# Patient Record
Sex: Male | Born: 1975 | Race: White | Hispanic: No | Marital: Married | Smoking: Never smoker
Health system: Southern US, Community
[De-identification: ages and names within clinical notes are randomized; demographics above are authoritative.]

## PROBLEM LIST (undated history)

## (undated) DIAGNOSIS — E785 Hyperlipidemia, unspecified: Secondary | ICD-10-CM

## (undated) HISTORY — DX: Hyperlipidemia, unspecified: E78.5

---

## 2000-02-26 ENCOUNTER — Ambulatory Visit (HOSPITAL_BASED_OUTPATIENT_CLINIC_OR_DEPARTMENT_OTHER): Admission: RE | Admit: 2000-02-26 | Discharge: 2000-02-26 | Payer: Self-pay | Admitting: Orthopedic Surgery

## 2000-11-23 HISTORY — PX: SHOULDER SURGERY: SHX246

## 2001-04-23 ENCOUNTER — Emergency Department (HOSPITAL_COMMUNITY): Admission: EM | Admit: 2001-04-23 | Discharge: 2001-04-23 | Payer: Self-pay | Admitting: Emergency Medicine

## 2001-05-08 ENCOUNTER — Emergency Department (HOSPITAL_COMMUNITY): Admission: EM | Admit: 2001-05-08 | Discharge: 2001-05-08 | Payer: Self-pay | Admitting: Emergency Medicine

## 2002-07-13 ENCOUNTER — Emergency Department (HOSPITAL_COMMUNITY): Admission: EM | Admit: 2002-07-13 | Discharge: 2002-07-14 | Payer: Self-pay | Admitting: Emergency Medicine

## 2006-05-19 ENCOUNTER — Emergency Department (HOSPITAL_COMMUNITY): Admission: EM | Admit: 2006-05-19 | Discharge: 2006-05-19 | Payer: Self-pay | Admitting: Emergency Medicine

## 2007-07-21 ENCOUNTER — Ambulatory Visit (HOSPITAL_COMMUNITY): Admission: RE | Admit: 2007-07-21 | Discharge: 2007-07-21 | Payer: Self-pay | Admitting: Internal Medicine

## 2008-10-08 ENCOUNTER — Emergency Department (HOSPITAL_BASED_OUTPATIENT_CLINIC_OR_DEPARTMENT_OTHER): Admission: EM | Admit: 2008-10-08 | Discharge: 2008-10-08 | Payer: Self-pay | Admitting: Emergency Medicine

## 2009-07-24 IMAGING — CR DG FACIAL BONES 1-2V
4 series · 4 of 4 positions shown · non-contrast
Comparison: None.

CLINICAL DATA: 32-year-old male status post blunt trauma to the
mandible.

FACIAL BONES - 1-2 VIEW

[t waters (1 of 2)]
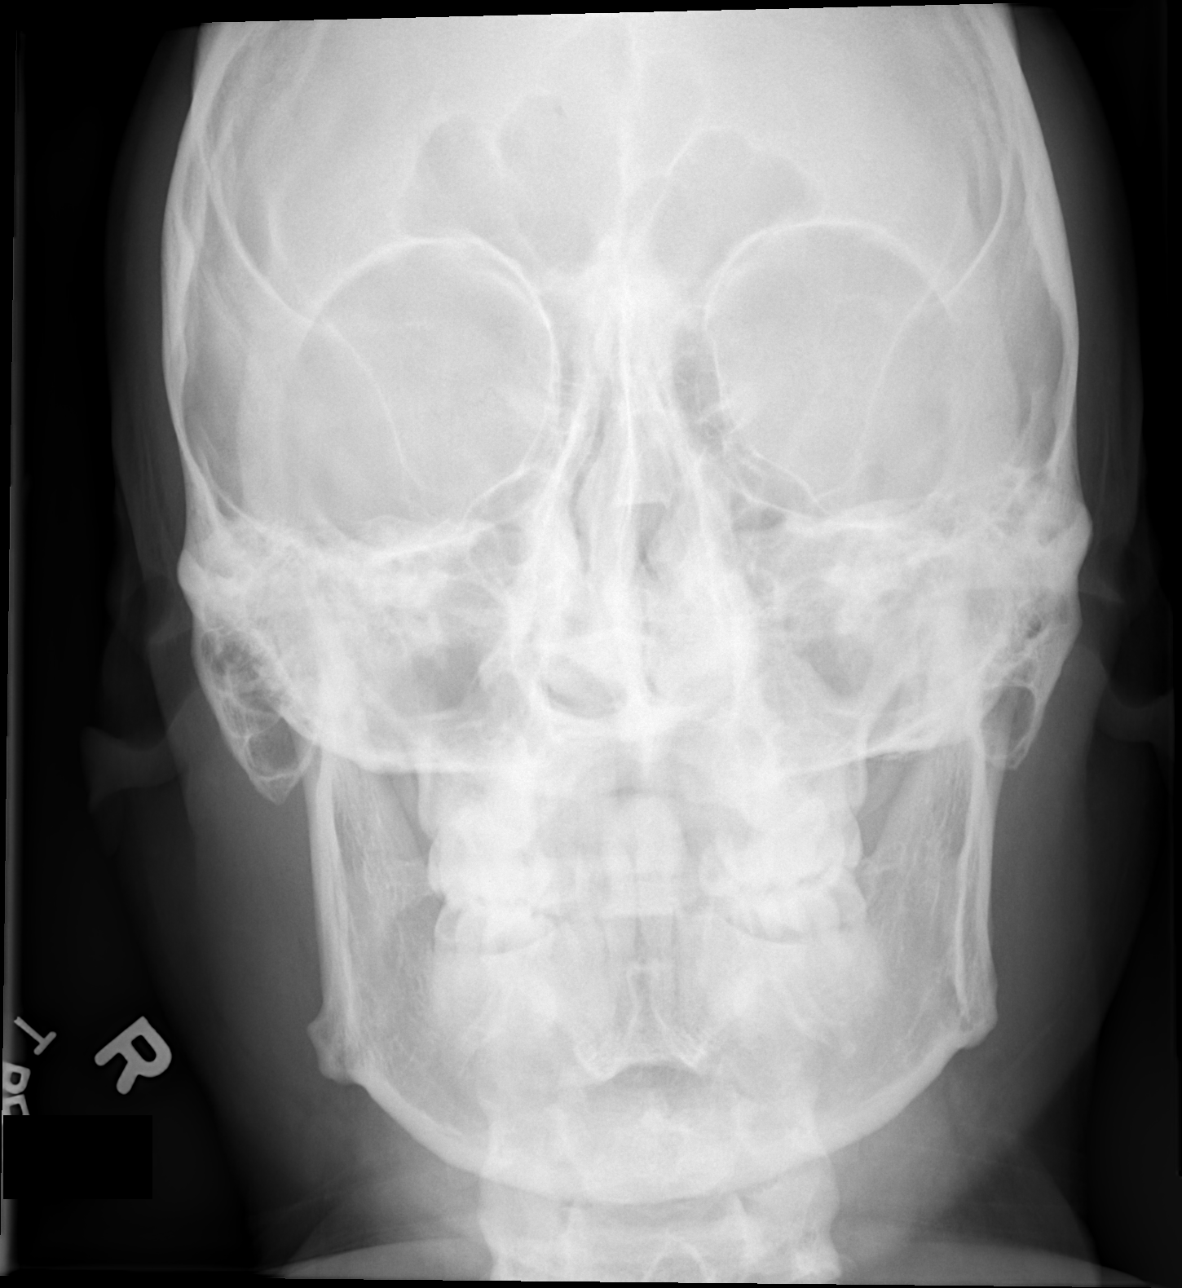

[t waters (2 of 2)]
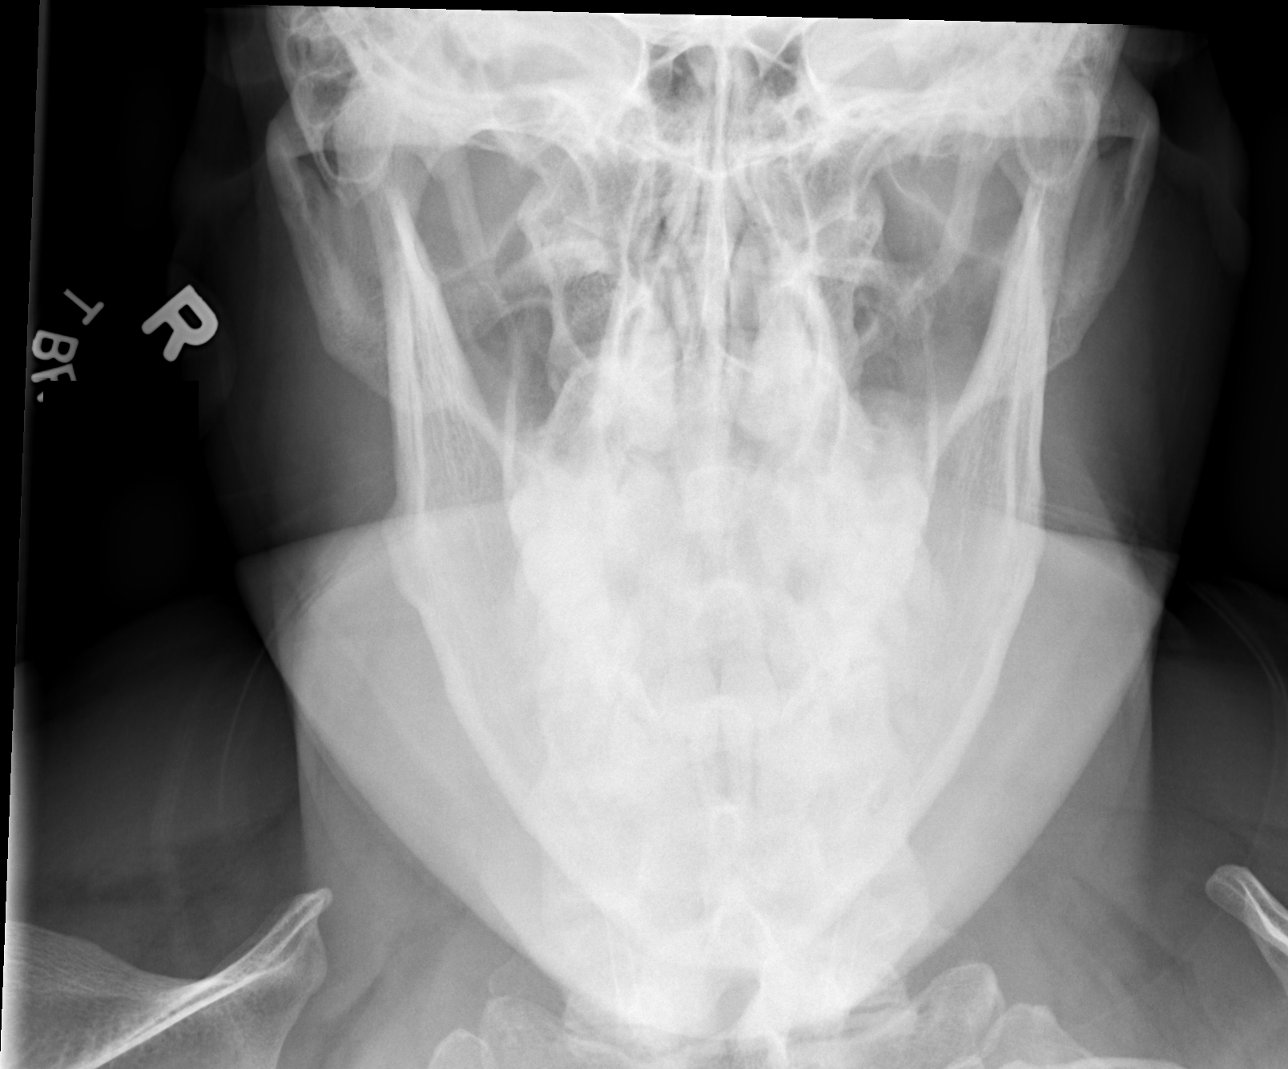

[t skull lat (1 of 2)]
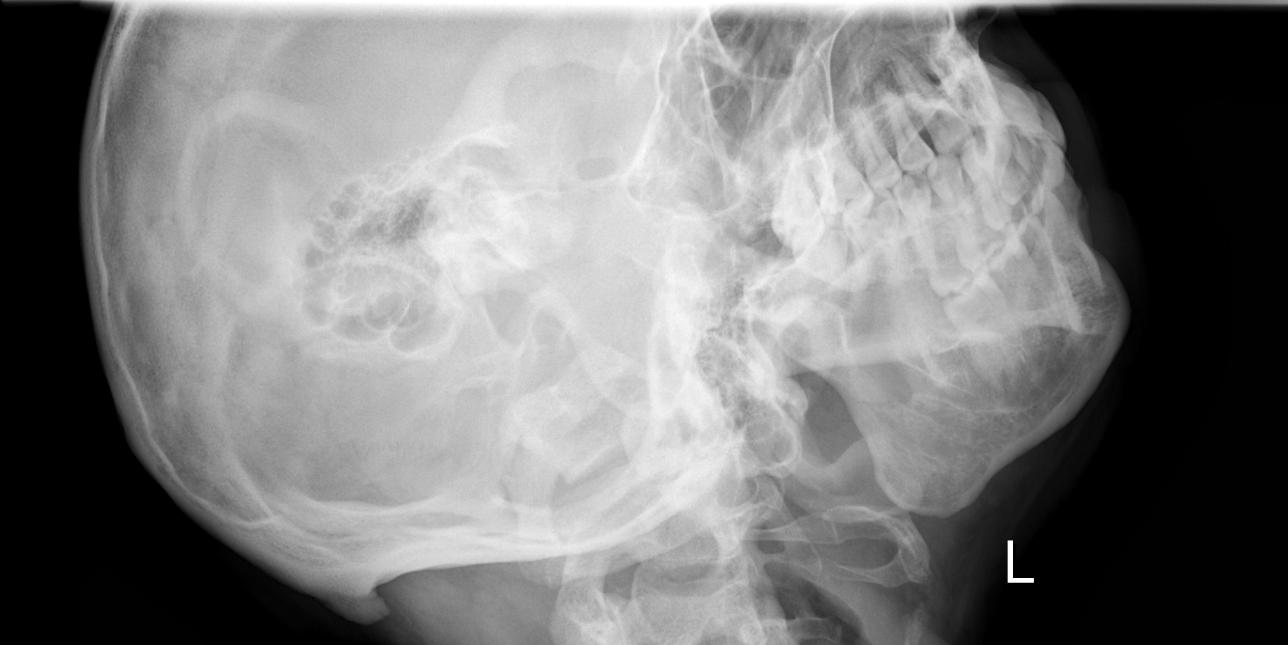

[t skull lat (2 of 2)]
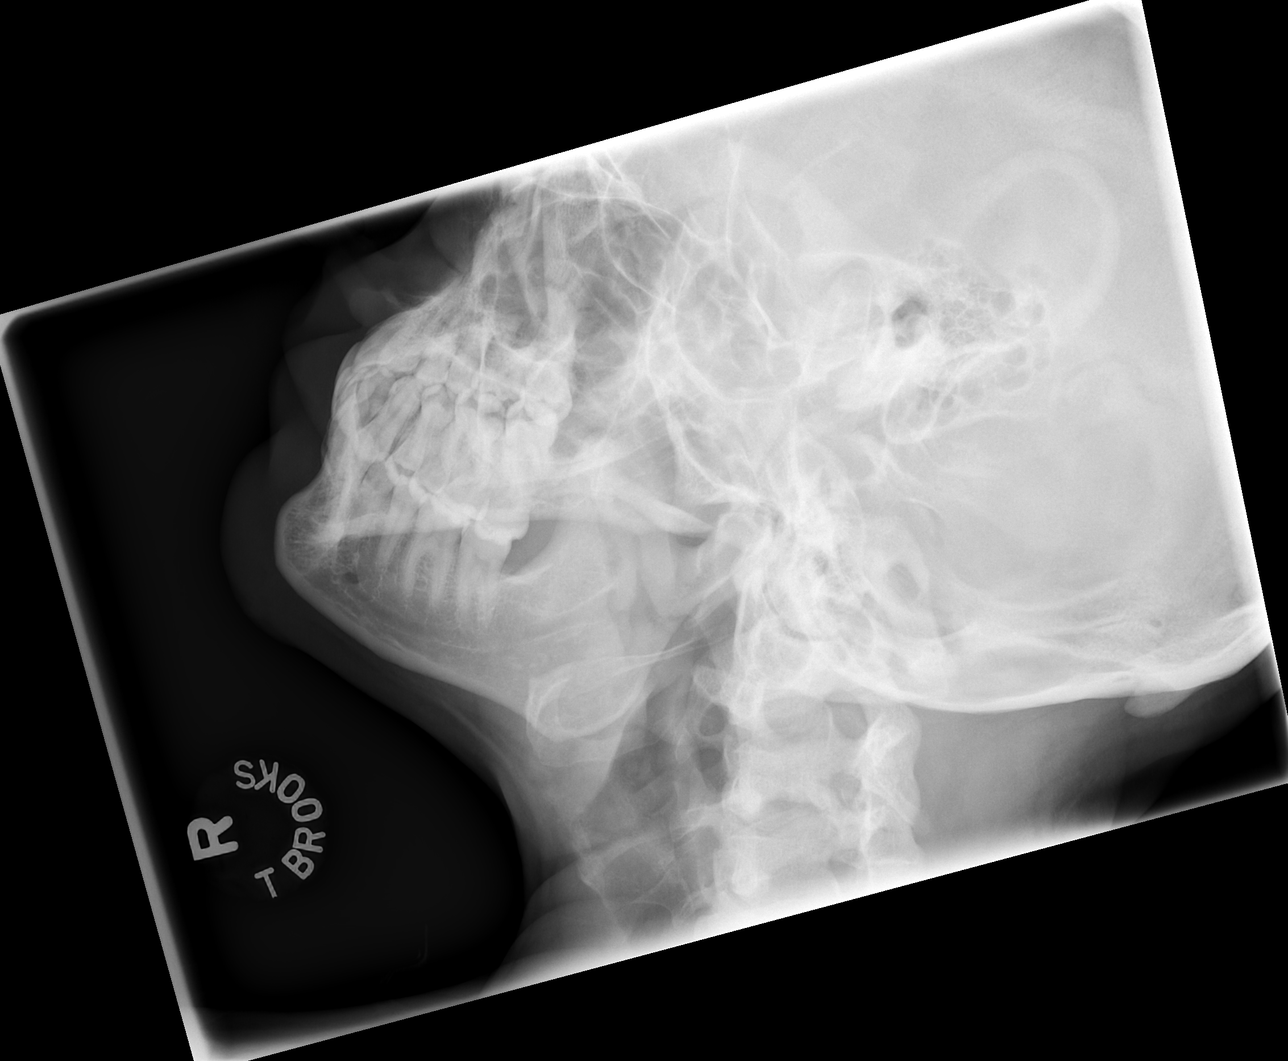

[4 of 4 positions shown; findings below may reference images not displayed]

FINDINGS: Bone mineralization is within normal limits.  No mandible
fracture identified.  Visualized paranasal sinuses and mastoids are
pneumatized.  No acute osseous abnormality identified.
IMPRESSION: No acute fracture or dislocation identified about the mandible.

## 2011-04-10 NOTE — Op Note (Signed)
[facility]  Patient:    Mark Boyle, Mark Boyle                    MRN: [id number] Proc. Date: 02/26/00 Adm. Date:  [id number] Attending:  Aldean Baker V                           Operative Report  PREOPERATIVE DIAGNOSIS:  SLAP lesion, right shoulder.  POSTOPERATIVE DIAGNOSES: 1. Partial rotator cuff tear. 2. Large grade 1 SLAP lesion.  PROCEDURE: Right shoulder arthroscopy with debridement of SLAP lesion and partial rotator cuff tear.  SURGEON:  Nadara Mustard, M.D.  ANESTHESIA:  General endotracheal.  ESTIMATED BLOOD LOSS:  Minimal.  ANTIBIOTICS:  None.  DRAINS:  None.  COMPLICATIONS:  None.  DISPOSITION:  PACU in stable condition.  INDICATIONS:  Patient is a 35 year old gentleman with painful right shoulder with positive OBrien test.  Symptoms consistent with a SLAP lesion with a MRI positive for a SLAP lesion.   Patient has failed conservative care with exercise therapy, anti-inflammatories and presents at this time for arthroscopic intervention. The risks and benefits were discussed, including infection, neurovascular injury, persistent pain, shoulder stiffness.  Patient states, he understands and wished to proceed at this time.  DESCRIPTION OF PROCEDURE:  Patient was brought to OR room 6 and underwent a general endotracheal anesthetic.  After adequate level of anesthesia obtained, patient as placed in the beach chair position and his right upper extremity was prepped using Duraprep and draped into a sterile field.  The scope was inserted through the posterior portal.  Visualization showed a large area of partial thickness rotator cuff tear, as well as, a very large degenerative SLAP lesion grade 1 with involvement of the labrum, biceps anchor and posterior labrum.  An 18-gauge needle was inserted from the outside/in technique and then a plastic cannula was inserted. A Vapor and shaver were used for debridement.  The Vapor  was used for debridement of soft tissue, as well as, hemostasis.  This was not touched any cartilage. The anterior/posterior labrum, as well as, the biceps anterior anchor were debrided. There was a good firm attachment of the biceps.  The biceps was adhered to the capsule secondary to chronic scarring and the superior aspects of the rotator cuff was also debrided of partial thickness rotator cuff tear.  The distal anchor of the rotator cuff showed a good attachment.  Instruments were removed.  The joint was infused with 20 cc of 0.5% Marcaine plain with 4 mg of morphine.  Portals were closed using 3-0 nylon.  The wound was covered with Adaptic, orthopedic sponges, ABD dressing and Hypafix tape.  Patient was extubated and taken to PACU in stable condition.  Follow-up in the office in two weeks, begin therapy tomorrow. Patient has a prescription for pain medicine. DD:  02/26/00 TD:  02/26/00 Job: [id number]

## 2017-01-19 DIAGNOSIS — M7661 Achilles tendinitis, right leg: Secondary | ICD-10-CM | POA: Diagnosis not present

## 2017-01-19 DIAGNOSIS — M6701 Short Achilles tendon (acquired), right ankle: Secondary | ICD-10-CM | POA: Diagnosis not present

## 2017-01-19 DIAGNOSIS — M71571 Other bursitis, not elsewhere classified, right ankle and foot: Secondary | ICD-10-CM | POA: Diagnosis not present

## 2017-07-16 ENCOUNTER — Telehealth (INDEPENDENT_AMBULATORY_CARE_PROVIDER_SITE_OTHER): Payer: Self-pay | Admitting: Orthopedic Surgery

## 2017-07-16 NOTE — Telephone Encounter (Signed)
I received VM from patient needing copy of records from 2001 surgery. I called him back [phone]. Advised him that I am able to get the OP note but we no longer have the ov notes. He is going to come by office today to sign release form.

## 2017-08-30 DIAGNOSIS — M7661 Achilles tendinitis, right leg: Secondary | ICD-10-CM | POA: Diagnosis not present

## 2017-09-02 ENCOUNTER — Ambulatory Visit (INDEPENDENT_AMBULATORY_CARE_PROVIDER_SITE_OTHER): Payer: 59 | Admitting: Orthopedic Surgery

## 2017-09-02 DIAGNOSIS — Z0000 Encounter for general adult medical examination without abnormal findings: Secondary | ICD-10-CM

## 2017-09-02 NOTE — Progress Notes (Addendum)
   Office Visit Note   Patient: Mark Boyle           Date of Birth: 1976-05-10           MRN: [id number] Visit Date: 09/02/2017              Requested by: No referring provider defined for this encounter. PCP: Chilton Greathouse, MD  Chief Complaint  Patient presents with  . Right Shoulder - Follow-up      HPI: Patient is a 41 year old gentleman who presents for evaluation of his shoulder. Patient reamed 2002 underwent shoulder arthroscopy for debridement and decompression. In 2014 he was having some further symptoms and decided to not proceed with any further intervention. Patient proceeded with exercise and strengthening and has regained full function of his shoulder. Patient swims has run marathons continues to do weight training all without symptoms.  Assessment & Plan: Visit Diagnoses:  1. Visit for well man health check     Plan: Patient is released without restrictions. He may return to work with no restrictions or accommodations.  Follow-Up Instructions: Return if symptoms worsen or fail to improve.   Ortho Exam  Patient is alert, oriented, no adenopathy, well-dressed, normal affect, normal respiratory effort. On examination patient has full range of motion of both shoulders. He has a little bit of prominence of the acromioclavicular joint bilaterally but this is asymptomatic to palpation. There is no pain with Neer or Hawkins impingement test and no pain with drop arm test a sulcus sign is negative there is no crepitation with range of motion. Patient has a normal exam of both shoulders.  Patient's MRI scan from 2014 was reviewed and this showed no evidence of a rotator cuff tear.  Imaging: No results found. No images are attached to the encounter.  Labs: No results found for: HGBA1C, ESRSEDRATE, CRP, LABURIC, REPTSTATUS, GRAMSTAIN, CULT, LABORGA  Orders:  No orders of the defined types were placed in this encounter.  No orders of the defined types were  placed in this encounter.    Procedures: No procedures performed  Clinical Data: No additional findings.  ROS:  All other systems negative, except as noted in the HPI. Review of Systems  Objective: Vital Signs: There were no vitals taken for this visit.  Specialty Comments:  No specialty comments available.  PMFS History: There are no active problems to display for this patient.  No past medical history on file.  No family history on file.  No past surgical history on file. Social History   Occupational History  . Not on file.   Social History Main Topics  . Smoking status: Not on file  . Smokeless tobacco: Not on file  . Alcohol use Not on file  . Drug use: Unknown  . Sexual activity: Not on file

## 2018-01-31 DIAGNOSIS — Z0000 Encounter for general adult medical examination without abnormal findings: Secondary | ICD-10-CM | POA: Diagnosis not present

## 2018-02-07 DIAGNOSIS — Z0000 Encounter for general adult medical examination without abnormal findings: Secondary | ICD-10-CM | POA: Diagnosis not present

## 2018-02-07 DIAGNOSIS — Z1389 Encounter for screening for other disorder: Secondary | ICD-10-CM | POA: Diagnosis not present

## 2018-02-07 DIAGNOSIS — L74510 Primary focal hyperhidrosis, axilla: Secondary | ICD-10-CM | POA: Diagnosis not present

## 2019-02-13 DIAGNOSIS — Z125 Encounter for screening for malignant neoplasm of prostate: Secondary | ICD-10-CM | POA: Diagnosis not present

## 2019-02-13 DIAGNOSIS — Z0000 Encounter for general adult medical examination without abnormal findings: Secondary | ICD-10-CM | POA: Diagnosis not present

## 2019-02-20 DIAGNOSIS — Z0000 Encounter for general adult medical examination without abnormal findings: Secondary | ICD-10-CM | POA: Diagnosis not present

## 2019-02-20 DIAGNOSIS — Z1331 Encounter for screening for depression: Secondary | ICD-10-CM | POA: Diagnosis not present

## 2019-02-20 DIAGNOSIS — E7849 Other hyperlipidemia: Secondary | ICD-10-CM | POA: Diagnosis not present

## 2019-02-20 DIAGNOSIS — L74510 Primary focal hyperhidrosis, axilla: Secondary | ICD-10-CM | POA: Diagnosis not present

## 2019-02-20 DIAGNOSIS — Z6830 Body mass index (BMI) 30.0-30.9, adult: Secondary | ICD-10-CM | POA: Diagnosis not present

## 2019-02-20 DIAGNOSIS — R82998 Other abnormal findings in urine: Secondary | ICD-10-CM | POA: Diagnosis not present

## 2020-03-06 DIAGNOSIS — Z0000 Encounter for general adult medical examination without abnormal findings: Secondary | ICD-10-CM | POA: Diagnosis not present

## 2020-03-06 DIAGNOSIS — Z125 Encounter for screening for malignant neoplasm of prostate: Secondary | ICD-10-CM | POA: Diagnosis not present

## 2020-03-06 DIAGNOSIS — E7849 Other hyperlipidemia: Secondary | ICD-10-CM | POA: Diagnosis not present

## 2020-03-13 DIAGNOSIS — Z0000 Encounter for general adult medical examination without abnormal findings: Secondary | ICD-10-CM | POA: Diagnosis not present

## 2020-03-13 DIAGNOSIS — E7849 Other hyperlipidemia: Secondary | ICD-10-CM | POA: Diagnosis not present

## 2020-03-13 DIAGNOSIS — Z1212 Encounter for screening for malignant neoplasm of rectum: Secondary | ICD-10-CM | POA: Diagnosis not present

## 2020-04-16 DIAGNOSIS — D485 Neoplasm of uncertain behavior of skin: Secondary | ICD-10-CM | POA: Diagnosis not present

## 2020-04-16 DIAGNOSIS — D225 Melanocytic nevi of trunk: Secondary | ICD-10-CM | POA: Diagnosis not present

## 2021-04-07 DIAGNOSIS — Z125 Encounter for screening for malignant neoplasm of prostate: Secondary | ICD-10-CM | POA: Diagnosis not present

## 2021-04-07 DIAGNOSIS — E785 Hyperlipidemia, unspecified: Secondary | ICD-10-CM | POA: Diagnosis not present

## 2021-04-14 DIAGNOSIS — R82998 Other abnormal findings in urine: Secondary | ICD-10-CM | POA: Diagnosis not present

## 2021-04-14 DIAGNOSIS — Z0000 Encounter for general adult medical examination without abnormal findings: Secondary | ICD-10-CM | POA: Diagnosis not present

## 2021-04-14 DIAGNOSIS — Z23 Encounter for immunization: Secondary | ICD-10-CM | POA: Diagnosis not present

## 2021-04-14 DIAGNOSIS — E785 Hyperlipidemia, unspecified: Secondary | ICD-10-CM | POA: Diagnosis not present

## 2022-04-28 DIAGNOSIS — E785 Hyperlipidemia, unspecified: Secondary | ICD-10-CM | POA: Diagnosis not present

## 2022-04-28 DIAGNOSIS — Z125 Encounter for screening for malignant neoplasm of prostate: Secondary | ICD-10-CM | POA: Diagnosis not present

## 2022-04-30 DIAGNOSIS — Z0000 Encounter for general adult medical examination without abnormal findings: Secondary | ICD-10-CM | POA: Diagnosis not present

## 2022-05-12 DIAGNOSIS — Z1331 Encounter for screening for depression: Secondary | ICD-10-CM | POA: Diagnosis not present

## 2022-05-12 DIAGNOSIS — Z1339 Encounter for screening examination for other mental health and behavioral disorders: Secondary | ICD-10-CM | POA: Diagnosis not present

## 2022-05-12 DIAGNOSIS — E785 Hyperlipidemia, unspecified: Secondary | ICD-10-CM | POA: Diagnosis not present

## 2022-05-12 DIAGNOSIS — Z0000 Encounter for general adult medical examination without abnormal findings: Secondary | ICD-10-CM | POA: Diagnosis not present

## 2022-06-08 DIAGNOSIS — E785 Hyperlipidemia, unspecified: Secondary | ICD-10-CM | POA: Diagnosis not present

## 2022-07-02 ENCOUNTER — Other Ambulatory Visit: Payer: Self-pay | Admitting: Internal Medicine

## 2022-07-02 DIAGNOSIS — E785 Hyperlipidemia, unspecified: Secondary | ICD-10-CM

## 2023-02-01 ENCOUNTER — Ambulatory Visit
Admission: RE | Admit: 2023-02-01 | Discharge: 2023-02-01 | Disposition: A | Discharge status: Home / Self Care | Payer: No Typology Code available for payment source | Source: Ambulatory Visit | Attending: Internal Medicine | Admitting: Internal Medicine

## 2023-02-01 DIAGNOSIS — E785 Hyperlipidemia, unspecified: Secondary | ICD-10-CM

## 2023-04-06 DIAGNOSIS — D2262 Melanocytic nevi of left upper limb, including shoulder: Secondary | ICD-10-CM | POA: Diagnosis not present

## 2023-04-06 DIAGNOSIS — L821 Other seborrheic keratosis: Secondary | ICD-10-CM | POA: Diagnosis not present

## 2023-04-06 DIAGNOSIS — D485 Neoplasm of uncertain behavior of skin: Secondary | ICD-10-CM | POA: Diagnosis not present

## 2023-04-06 DIAGNOSIS — D2261 Melanocytic nevi of right upper limb, including shoulder: Secondary | ICD-10-CM | POA: Diagnosis not present

## 2023-04-06 DIAGNOSIS — D225 Melanocytic nevi of trunk: Secondary | ICD-10-CM | POA: Diagnosis not present

## 2023-05-31 DIAGNOSIS — Z1212 Encounter for screening for malignant neoplasm of rectum: Secondary | ICD-10-CM | POA: Diagnosis not present

## 2023-05-31 DIAGNOSIS — E785 Hyperlipidemia, unspecified: Secondary | ICD-10-CM | POA: Diagnosis not present

## 2023-05-31 DIAGNOSIS — Z125 Encounter for screening for malignant neoplasm of prostate: Secondary | ICD-10-CM | POA: Diagnosis not present

## 2023-06-03 DIAGNOSIS — R82998 Other abnormal findings in urine: Secondary | ICD-10-CM | POA: Diagnosis not present

## 2023-06-04 DIAGNOSIS — Z0000 Encounter for general adult medical examination without abnormal findings: Secondary | ICD-10-CM | POA: Diagnosis not present

## 2023-06-04 DIAGNOSIS — R911 Solitary pulmonary nodule: Secondary | ICD-10-CM | POA: Diagnosis not present

## 2023-06-04 DIAGNOSIS — Z1339 Encounter for screening examination for other mental health and behavioral disorders: Secondary | ICD-10-CM | POA: Diagnosis not present

## 2023-06-04 DIAGNOSIS — Z1331 Encounter for screening for depression: Secondary | ICD-10-CM | POA: Diagnosis not present

## 2023-06-10 DIAGNOSIS — L988 Other specified disorders of the skin and subcutaneous tissue: Secondary | ICD-10-CM | POA: Diagnosis not present

## 2023-06-10 DIAGNOSIS — D485 Neoplasm of uncertain behavior of skin: Secondary | ICD-10-CM | POA: Diagnosis not present

## 2023-08-31 ENCOUNTER — Ambulatory Visit (AMBULATORY_SURGERY_CENTER): Payer: BC Managed Care – PPO

## 2023-08-31 VITALS — Ht 71.0 in | Wt 224.8 lb

## 2023-08-31 DIAGNOSIS — Z1211 Encounter for screening for malignant neoplasm of colon: Secondary | ICD-10-CM

## 2023-08-31 MED ORDER — NA SULFATE-K SULFATE-MG SULF 17.5-3.13-1.6 GM/177ML PO SOLN: 1.0000 | Freq: Once | ORAL | 0 refills | Status: AC

## 2023-08-31 NOTE — Progress Notes (Signed)
No egg or soy allergy known to patient  No issues known to pt with past sedation with any surgeries or procedures Patient denies ever being told they had issues or difficulty with intubation  No FH of Malignant Hyperthermia Pt is not on diet pills Pt is not on  home 02  Pt is not on blood thinners  Pt denies issues with constipation  No A fib or A flutter Have any cardiac testing pending--no LOA: independent  Prep: suprep   Patient's chart reviewed by Cathlyn Parsons CNRA prior to previsit and patient appropriate for the [facility].  Previsit completed and red dot placed by patient's name on their procedure day (on provider's schedule).     PV competed with patient. Prep instructions sent via mychart and hard copy given at [facility].

## 2023-09-14 ENCOUNTER — Encounter: Payer: Self-pay | Admitting: Gastroenterology

## 2023-09-22 ENCOUNTER — Encounter: Payer: Self-pay | Admitting: Certified Registered"

## 2023-09-27 ENCOUNTER — Ambulatory Visit (AMBULATORY_SURGERY_CENTER): Payer: BC Managed Care – PPO | Admitting: Gastroenterology

## 2023-09-27 ENCOUNTER — Encounter: Payer: Self-pay | Admitting: Gastroenterology

## 2023-09-27 VITALS — BP 114/66 | HR 46 | Temp 97.3°F | Resp 10 | Ht 70.5 in | Wt 224.8 lb

## 2023-09-27 DIAGNOSIS — D124 Benign neoplasm of descending colon: Secondary | ICD-10-CM

## 2023-09-27 DIAGNOSIS — Z1211 Encounter for screening for malignant neoplasm of colon: Secondary | ICD-10-CM

## 2023-09-27 DIAGNOSIS — K640 First degree hemorrhoids: Secondary | ICD-10-CM

## 2023-09-27 MED ORDER — SODIUM CHLORIDE 0.9 % IV SOLN: 500.0000 mL | INTRAVENOUS | Status: DC

## 2023-09-27 NOTE — Progress Notes (Signed)
Sedate, gd SR, tolerated procedure well, VSS, report to RN 

## 2023-09-27 NOTE — Progress Notes (Signed)
   GASTROENTEROLOGY PROCEDURE H&P NOTE   Primary Care Physician: Chilton Greathouse, MD    Reason for Procedure:  Colon Cancer screening  Plan:    Colonoscopy  Patient is appropriate for endoscopic procedure(s) in the ambulatory ([facility]) setting.  The nature of the procedure, as well as the risks, benefits, and alternatives were carefully and thoroughly reviewed with the patient. Ample time for discussion and questions allowed. The patient understood, was satisfied, and agreed to proceed.     HPI: Mark Boyle is a 47 y.o. male who presents for colonoscopy for routine Colon Cancer screening.  No active GI symptoms.  No known family history of colon cancer or related malignancy.  Patient is otherwise without complaints or active issues today.  Past Medical History:  Diagnosis Date   Hyperlipidemia     Past Surgical History:  Procedure Laterality Date   SHOULDER SURGERY  2002    Prior to Admission medications   Medication Sig Start Date End Date Taking? Authorizing Provider  rosuvastatin (CRESTOR) 10 MG tablet Take 10 mg by mouth daily. 08/04/23  Yes [provider]    Current Outpatient Medications  Medication Sig Dispense Refill   rosuvastatin (CRESTOR) 10 MG tablet Take 10 mg by mouth daily.     Current Facility-Administered Medications  Medication Dose Route Frequency Provider Last Rate Last Admin   0.9 %  sodium chloride infusion  500 mL Intravenous Continuous Surina Storts V, DO        Allergies as of 09/27/2023   (No Known Allergies)    Family History  Problem Relation Age of Onset   Esophageal cancer Father 44   Colon cancer Neg Hx    Rectal cancer Neg Hx    Stomach cancer Neg Hx    Colon polyps Neg Hx     Social History   Socioeconomic History   Marital status: Married    Spouse name: Not on file   Number of children: Not on file   Years of education: Not on file   Highest education level: Not on file  Occupational History   Not on  file  Tobacco Use   Smoking status: Never   Smokeless tobacco: Never  Vaping Use   Vaping status: Never Used  Substance and Sexual Activity   Alcohol use: Yes    Comment: social   Drug use: Never   Sexual activity: Not on file  Other Topics Concern   Not on file  Social History Narrative   Not on file   Social Determinants of Health   Financial Resource Strain: Not on file  Food Insecurity: Not on file  Transportation Needs: Not on file  Physical Activity: Not on file  Stress: Not on file  Social Connections: Not on file  Intimate Partner Violence: Not on file    Physical Exam: Vital signs in last 24 hours: @BP  134/86   Pulse 60   Temp (!) 97.3 F (36.3 C)   Ht 5' 10.5" (1.791 m)   Wt 224 lb 12.8 oz (102 kg)   SpO2 97%   BMI 31.80 kg/m  GEN: NAD EYE: Sclerae anicteric ENT: MMM CV: Non-tachycardic Pulm: CTA b/l GI: Soft, NT/ND NEURO:  Alert & Oriented x 3   Doristine Locks, DO [facility]   09/27/2023 9:36 AM

## 2023-09-27 NOTE — Op Note (Signed)
[facility] Patient Name: Mark Boyle Procedure Date: 09/27/2023 9:36 AM MRN: [id number] Endoscopist: Doristine Locks , MD, [id number] Age: 47 Referring MD:  Date of Birth: 1976-06-01 Gender: Male Account #: [id number] Procedure:                Colonoscopy Indications:              Screening for colorectal malignant neoplasm, This                            is the patient's first colonoscopy Medicines:                Monitored Anesthesia Care Procedure:                Pre-Anesthesia Assessment:                           - Prior to the procedure, a History and Physical                            was performed, and patient medications and                            allergies were reviewed. The patient's tolerance of                            previous anesthesia was also reviewed. The risks                            and benefits of the procedure and the sedation                            options and risks were discussed with the patient.                            All questions were answered, and informed consent                            was obtained. Prior Anticoagulants: The patient has                            taken no anticoagulant or antiplatelet agents. ASA                            Grade Assessment: I - A normal, healthy patient.                            After reviewing the risks and benefits, the patient                            was deemed in satisfactory condition to undergo the                            procedure.  After obtaining informed consent, the colonoscope                            was passed under direct vision. Throughout the                            procedure, the patient's blood pressure, pulse, and                            oxygen saturations were monitored continuously. The                            CF [id number] #[id number] was introduced through the anus                            and advanced to the the  terminal ileum. The                            colonoscopy was performed without difficulty. The                            patient tolerated the procedure well. The quality                            of the bowel preparation was good. The terminal                            ileum, ileocecal valve, appendiceal orifice, and                            rectum were photographed. Scope In: 9:48:35 AM Scope Out: 10:01:19 AM Scope Withdrawal Time: 0 hours 9 minutes 45 seconds  Total Procedure Duration: 0 hours 12 minutes 44 seconds  Findings:                 The perianal and digital rectal examinations were                            normal.                           A 4 mm polyp was found in the descending colon. The                            polyp was sessile. The polyp was removed with a                            cold snare. Resection and retrieval were complete.                            Estimated blood loss was minimal.                           Non-bleeding internal hemorrhoids were found during  retroflexion. The hemorrhoids were small.                           The exam was otherwise normal throughout the                            remainder of the colon.                           The terminal ileum appeared normal. Complications:            No immediate complications. Estimated Blood Loss:     Estimated blood loss was minimal. Impression:               - One 4 mm polyp in the descending colon, removed                            with a cold snare. Resected and retrieved.                           - Non-bleeding internal hemorrhoids.                           - The examined portion of the ileum was normal. Recommendation:           - Patient has a contact number available for                            emergencies. The signs and symptoms of potential                            delayed complications were discussed with the                            patient.  Return to normal activities tomorrow.                            Written discharge instructions were provided to the                            patient.                           - Resume previous diet.                           - Continue present medications.                           - Await pathology results.                           - Repeat colonoscopy for surveillance based on                            pathology results.                           -  Return to GI office PRN. Doristine Locks, MD 09/27/2023 10:05:05 AM

## 2023-09-27 NOTE — Progress Notes (Signed)
Called to room to assist during endoscopic procedure.  Patient ID and intended procedure confirmed with present staff. Received instructions for my participation in the procedure from the performing physician.  

## 2023-09-27 NOTE — Patient Instructions (Signed)
Thank you for letting us to take care of your healthcare needs today. Please see handouts given to you on Polyps and Hemorrhoids.    YOU HAD AN ENDOSCOPIC PROCEDURE TODAY AT THE [facility]:   Refer to the procedure report that was given to you for any specific questions about what was found during the examination.  If the procedure report does not answer your questions, please call your gastroenterologist to clarify.  If you requested that your care partner not be given the details of your procedure findings, then the procedure report has been included in a sealed envelope for you to review at your convenience later.  YOU SHOULD EXPECT: Some feelings of bloating in the abdomen. Passage of more gas than usual.  Walking can help get rid of the air that was put into your GI tract during the procedure and reduce the bloating. If you had a lower endoscopy (such as a colonoscopy or flexible sigmoidoscopy) you may notice spotting of blood in your stool or on the toilet paper. If you underwent a bowel prep for your procedure, you may not have a normal bowel movement for a few days.  Please Note:  You might notice some irritation and congestion in your nose or some drainage.  This is from the oxygen used during your procedure.  There is no need for concern and it should clear up in a day or so.  SYMPTOMS TO REPORT IMMEDIATELY:  Following lower endoscopy (colonoscopy or flexible sigmoidoscopy):  Excessive amounts of blood in the stool  Significant tenderness or worsening of abdominal pains  Swelling of the abdomen that is new, acute  Fever of 100F or higher   For urgent or emergent issues, a gastroenterologist can be reached at any hour by calling [phone]. Do not use MyChart messaging for urgent concerns.    DIET:  We do recommend a small meal at first, but then you may proceed to your regular diet.  Drink plenty of fluids but you should avoid alcoholic beverages for 24  hours.  ACTIVITY:  You should plan to take it easy for the rest of today and you should NOT DRIVE or use heavy machinery until tomorrow (because of the sedation medicines used during the test).    FOLLOW UP: Our staff will call the number listed on your records the next business day following your procedure.  We will call around 7:15- 8:00 am to check on you and address any questions or concerns that you may have regarding the information given to you following your procedure. If we do not reach you, we will leave a message.     If any biopsies were taken you will be contacted by phone or by letter within the next 1-3 weeks.  Please call us at [phone] if you have not heard about the biopsies in 3 weeks.    SIGNATURES/CONFIDENTIALITY: You and/or your care partner have signed paperwork which will be entered into your electronic medical record.  These signatures attest to the fact that that the information above on your After Visit Summary has been reviewed and is understood.  Full responsibility of the confidentiality of this discharge information lies with you and/or your care-partner.

## 2023-09-28 ENCOUNTER — Telehealth: Payer: Self-pay

## 2023-09-28 NOTE — Telephone Encounter (Signed)
  Follow up Call-     09/27/2023    8:37 AM  Call back number  Post procedure Call Back phone  # [phone]  Permission to leave phone message Yes     Patient questions:  Do you have a fever, pain , or abdominal swelling? No. Pain Score  0 *  Have you tolerated food without any problems? Yes.    Have you been able to return to your normal activities? Yes.    Do you have any questions about your discharge instructions: Diet   No. Medications  No. Follow up visit  No.  Do you have questions or concerns about your Care? No.  Actions: * If pain score is 4 or above: No action needed, pain <4.

## 2023-10-01 LAB — SURGICAL PATHOLOGY

## 2023-10-06 NOTE — Telephone Encounter (Signed)
Patient called and stated he had a colonoscopy recently and based off the recent one he wanted some clarification on weather he needed to have another colonoscopy done in 7-10 year. Patient requested a call back at [phone] and if he does not answer it is ok to leave a voicemail.

## 2023-10-06 NOTE — Telephone Encounter (Signed)
Patient is advised that since the removed polyp from recent colonoscopy was completely benign, he would not be due for repeat colonoscopy for 10 years per GI guidelines. Advised that we will send correspondence when he is due for repeat procedure so he is aware. Patient verbalizes understanding. Recall is already place in [facility] for colonoscopy in 2034.

## 2023-10-13 ENCOUNTER — Other Ambulatory Visit (HOSPITAL_COMMUNITY): Payer: Self-pay

## 2023-10-13 ENCOUNTER — Ambulatory Visit (HOSPITAL_COMMUNITY)
Admission: RE | Admit: 2023-10-13 | Discharge: 2023-10-13 | Disposition: A | Discharge status: Home / Self Care | Payer: BC Managed Care – PPO | Source: Ambulatory Visit | Attending: Cardiovascular Disease | Admitting: Cardiovascular Disease

## 2023-10-13 ENCOUNTER — Other Ambulatory Visit (HOSPITAL_COMMUNITY): Payer: Self-pay | Admitting: Medical

## 2023-10-13 ENCOUNTER — Ambulatory Visit (HOSPITAL_BASED_OUTPATIENT_CLINIC_OR_DEPARTMENT_OTHER)
Admission: RE | Admit: 2023-10-13 | Discharge: 2023-10-13 | Disposition: A | Discharge status: Home / Self Care | Payer: BC Managed Care – PPO | Source: Ambulatory Visit | Attending: Cardiovascular Disease | Admitting: Cardiovascular Disease

## 2023-10-13 VITALS — BP 130/89 | HR 57

## 2023-10-13 DIAGNOSIS — I82431 Acute embolism and thrombosis of right popliteal vein: Secondary | ICD-10-CM | POA: Diagnosis not present

## 2023-10-13 DIAGNOSIS — M79604 Pain in right leg: Secondary | ICD-10-CM | POA: Diagnosis not present

## 2023-10-13 DIAGNOSIS — M79661 Pain in right lower leg: Secondary | ICD-10-CM | POA: Diagnosis not present

## 2023-10-13 MED ORDER — APIXABAN 5 MG PO TABS
5.0000 mg | ORAL_TABLET | Freq: Two times a day (BID) | ORAL | 5 refills | Status: DC
  Filled 2023-10-13: qty 60, 30d supply, fill #0
  Filled 2023-11-27 – 2023-11-29 (×2): qty 60, 30d supply, fill #1
  Filled 2024-01-28: qty 60, 30d supply, fill #3
  Filled 2024-02-27: qty 60, 30d supply, fill #4
  Filled 2024-03-31: qty 60, 30d supply, fill #5

## 2023-10-13 MED ORDER — APIXABAN (ELIQUIS) VTE STARTER PACK (10MG AND 5MG)
ORAL_TABLET | ORAL | 0 refills | Status: DC
  Filled 2023-10-13: qty 74, 30d supply, fill #0

## 2023-10-13 NOTE — Progress Notes (Addendum)
DVT Clinic Note  Name: Mark Boyle     MRN: [id number]     DOB: [birth year]     Sex: male  PCP: Chilton Greathouse, MD  Today's Visit: Visit Information: Initial Visit  Referred to DVT Clinic by: Orthopedic Surgery - Harlene Salts, PA-C [facility]) Referred to CPP by: Dr. Lenell Antu Reason for referral:  Chief Complaint  Patient presents with   DVT   HISTORY OF PRESENT ILLNESS: Mark Boyle is a 47 y.o. male who presents after diagnosis of DVT for medication management. Patient began to have pain on the bottom of his right foot a couple weeks ago. He has a history of plantar fasciitis which felt similar to this pain so he started icing it. Over the past few days he began to have redness, warmth, and swelling around his ankle as well as calf tightness and pain. On 10/13/23 his wife recommended he go to urgent care for evaluation where he was referred for vascular ultrasound. 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 He was referred to DVT clinic for evaluation and to initiate treatment.  Today, patient reports pain is mostly in the back of his right knee. He has swelling and redness around his ankle as well. No recent travel for long periods of time. He works as a Emergency planning/management officer for [employer] and does spend time in the car every day, but reports this is never for more than 1 hour at a time without stopping to get out and walk around. He is very active and exercises sometimes twice a day running or lifting weights. He denies any recent injuries. He has had a dry cough for the past month, but did not spend more time sedentary. Tested negative for COVID. Denies unintentional weight loss. Up to date on cancer screenings.   Positive Thrombotic Risk Factors: None Present Bleeding Risk Factors: None Present  Negative Thrombotic Risk Factors: Previous VTE, Recent  surgery (within 3 months), Recent trauma (within 3 months), Recent admission to hospital with acute illness (within 3 months), Paralysis, paresis, or recent plaster cast immobilization of lower extremity, Bed rest >72 hours within 3 months, Central venous catheterization, Sedentary journey lasting >8 hours within 4 weeks, Pregnancy, Within 6 weeks postpartum, Testosterone therapy, Recent cesarean section (within 3 months), Estrogen therapy, Recent COVID diagnosis (within 3 months), Active cancer, Smoking, Known thrombophilic condition, Non-malignant, chronic inflammatory condition, Erythropoiesis-stimulating agent, Obesity, Older age  Rx Insurance Coverage: Commercial Rx Affordability: Eliquis is $80 for a 30 day supply. We used the one time free trial card to fill Eliquis starter pack at [facility] pharmacy during the visit. Will use co-pay cards to reduce cost of Eliquis to $10 per month for refills. Rx Assistance Provided:  Free 30-day trial card Co-pay card Preferred Pharmacy: [facility] for delivery  Past Medical History:  Diagnosis Date   Hyperlipidemia     Past Surgical History:  Procedure Laterality Date   SHOULDER SURGERY  2002    Social History   Socioeconomic History   Marital status: Married    Spouse name: Not on file   Number of children: Not on file   Years of education: Not on file   Highest education level: Not on file  Occupational History   Not on file  Tobacco Use   Smoking status: Never   Smokeless tobacco: Never  Vaping Use   Vaping status: Never Used  Substance  and Sexual Activity   Alcohol use: Yes    Comment: social   Drug use: Never   Sexual activity: Not on file  Other Topics Concern   Not on file  Social History Narrative   Not on file   Social Determinants of Health   Financial Resource Strain: Not on file  Food Insecurity: Not on file  Transportation Needs: Not on file  Physical Activity: Not on file  Stress: Not on file  Social Connections: Not on  file  Intimate Partner Violence: Not on file    Family History  Problem Relation Age of Onset   Esophageal cancer Father 76   Colon cancer Neg Hx    Rectal cancer Neg Hx    Stomach cancer Neg Hx    Colon polyps Neg Hx     Allergies as of 10/13/2023   (No Known Allergies)    Current Outpatient Medications on File Prior to Encounter  Medication Sig Dispense Refill   rosuvastatin (CRESTOR) 10 MG tablet Take 10 mg by mouth daily.     No current facility-administered medications on file prior to encounter.   REVIEW OF SYSTEMS:  Review of Systems  Respiratory:  Negative for shortness of breath.   Cardiovascular:  Positive for leg swelling. Negative for chest pain and palpitations.  Musculoskeletal:  Positive for myalgias.  Neurological:  Negative for dizziness and tingling.   PHYSICAL EXAMINATION:  Vitals:   10/13/23 1349  BP: 130/89  Pulse: (!) 57  SpO2: 97%    Physical Exam Cardiovascular:     Rate and Rhythm: Normal rate.  Pulmonary:     Effort: Pulmonary effort is normal.  Musculoskeletal:        General: Tenderness (right leg) present.     Right lower leg: Edema (trace to 1+) present.     Left lower leg: No edema.  Psychiatric:        Mood and Affect: Mood normal.        Behavior: Behavior normal.        Thought Content: Thought content normal.   Villalta Score for Post-Thrombotic Syndrome: Pain: Moderate Cramps: Absent Heaviness: Absent Paresthesia: Absent Pruritus: Absent Pretibial Edema: Mild Skin Induration: Absent Hyperpigmentation: Absent Redness: Mild Venous Ectasia: Mild Pain on calf compression: Mild Villalta Preliminary Score: 6 If venous ulcer is present and score is <15, then 15 points total are assigned: Absent Villalta Total Score: 6  LABS:  From PCP visit in July 2024: eGFR 72, Cr 1.1, Hgb 15.9, Platelets 165k, AST 31, ALT 33  [facility] Vascular Lab Studies:  10/13/23 ultrasound: Summary:  RIGHT:  - Findings consistent with acute deep  vein thrombosis involving the right  popliteal vein.    - Findings consistent with chronic deep vein thrombosis involving the  right posterior tibial veins, right peroneal veins, and TPT.    - No cystic structure found in the popliteal fossa.    LEFT:  - No evidence of common femoral vein obstruction.   ASSESSMENT: Location of DVT: Right popliteal vein, Right distal vein Cause of DVT: unprovoked - Patient with no known risk factors for DVT. He does spend about 2.5 hours a day in the car for work, but this is intermittent and he always stops to get out of the car at least every hour. No history of recent surgery, dx of COVID, or hospitalization. He did have a cold about a month ago and has had a cough for about a month now, but this did not result  in any prolonged sedentary periods of time. Patient has an active lifestyle exercising almost daily.   Ultrasound showed chronic clot extending from the tibioperoneal trunk to the peroneal veins and acute DVT in the right popliteal vein which seems to be consistent with symptoms starting a few weeks ago and worsening over the past few days. Will start Eliquis for DVT treatment and refer him to hematology for further work up of unprovoked DVT and to determine duration of treatment. He has no medication access issues at this time. Filled starter pack for free at [facility] pharmacy during today's visit and provided refills to his preferred pharmacy. He has Nurse, learning disability and copay card will reduce cost to $10 per month which is affordable for him. Counseled and answered all the patient and his wife's questions regarding the DVT and medication.   PLAN: -Start apixaban (Eliquis) 10 mg twice daily for 7 days followed by 5 mg twice daily. -Expected duration of therapy: per hematology. Therapy started on 10/13/23. -Patient educated on purpose, proper use and potential adverse effects of apixaban (Eliquis). -Discussed importance of taking medication around the  same time every day. -Advised patient of medications to avoid (NSAIDs, aspirin doses >100 mg daily). -Educated that Tylenol (acetaminophen) is the preferred analgesic to lower the risk of bleeding. -Advised patient to alert all providers of anticoagulation therapy prior to starting a new medication or having a procedure. -Emphasized importance of monitoring for signs and symptoms of bleeding (abnormal bruising, prolonged bleeding, nose bleeds, bleeding from gums, discolored urine, black tarry stools). -Educated patient to present to the ED if emergent signs and symptoms of new thrombosis occur. -Counseled patient to wear compression stockings daily, removing at night. Elevate legs to help with swelling.   Follow up: Hematology referral placed. DVT Clinic available as needed.   Jarrett Ables, PharmD PGY-1 Pharmacy Resident  Pervis Hocking, PharmD, BCACP, CPP Deep Vein Thrombosis Clinic Clinical Pharmacist Practitioner Office: [phone]

## 2023-10-13 NOTE — Patient Instructions (Signed)
-  Start apixaban (Eliquis) 10 mg twice daily for 7 days followed by 5 mg twice daily. -Your refills have been sent to [facility]. You may need to call the pharmacy to ask them to fill this when you start to run low on your current supply. [phone] -We have put in a referral to hematology for follow up. -It is important to take your medication around the same time every day.  -Avoid NSAIDs like ibuprofen (Advil, Motrin) and naproxen (Aleve) as well as aspirin doses over 100 mg daily. -Tylenol (acetaminophen) is the preferred over the counter pain medication to lower the risk of bleeding. -Be sure to alert all of your health care providers that you are taking an anticoagulant prior to starting a new medication or having a procedure. -Monitor for signs and symptoms of bleeding (abnormal bruising, prolonged bleeding, nose bleeds, bleeding from gums, discolored urine, black tarry stools). If you have fallen and hit your head OR if your bleeding is severe or not stopping, seek emergency care.  -Go to the emergency room if emergent signs and symptoms of new clot occur (new or worse swelling and pain in an arm or leg, shortness of breath, chest pain, fast or irregular heartbeats, lightheadedness, dizziness, fainting, coughing up blood) or if you experience a significant color change (pale or blue) in the extremity that has the DVT.  -We recommend you wear compression stockings (20-30 mmHg) as long as you are having swelling or pain. Be sure to purchase the correct size and take them off at night.   [facility] DVT Clinic [address] [place], [place], [place] [address] Enter the hospital through Entrance C off [address] and pull up to the Heart & Vascular Center entrance to the free valet parking.  Check in for your appointment at the Heart & Vascular Center.   If you have any questions or need to reschedule an appointment, please call [phone] [facility].  If you  are having an emergency, call 911 or present to the nearest emergency room.   What is a DVT?  -Deep vein thrombosis (DVT) is a condition in which a blood clot forms in a vein of the deep venous system which can occur in the lower leg, thigh, pelvis, arm, or neck. This condition is serious and can be life-threatening if the clot travels to the arteries of the lungs and causing a blockage (pulmonary embolism, PE). A DVT can also damage veins in the leg, which can lead to long-term venous disease, leg pain, swelling, discoloration, and ulcers or sores (post-thrombotic syndrome).  -Treatment may include taking an anticoagulant medication to prevent more clots from forming and the current clot from growing, wearing compression stockings, and/or surgical procedures to remove or dissolve the clot.

## 2023-10-14 ENCOUNTER — Other Ambulatory Visit: Payer: Self-pay

## 2023-10-14 ENCOUNTER — Other Ambulatory Visit (HOSPITAL_COMMUNITY): Payer: Self-pay

## 2023-10-18 ENCOUNTER — Telehealth (HOSPITAL_COMMUNITY): Payer: Self-pay | Admitting: Student-PharmD

## 2023-10-18 NOTE — Telephone Encounter (Signed)
Patient called asking if it would be okay for him to resume upper body weight lifting in the gym and light walking in light of his recent RLE DVT diagnosis. Advised him that this would be fine to resume. He informed me that he has already received his refill of Eliquis from our [facility] by delivery and was very pleased with the experience. He thanked me again for the care provided to him last week in DVT Clinic. Encouraged him to call if any other questions come up.

## 2023-10-29 DIAGNOSIS — I82451 Acute embolism and thrombosis of right peroneal vein: Secondary | ICD-10-CM | POA: Diagnosis not present

## 2023-10-29 DIAGNOSIS — R051 Acute cough: Secondary | ICD-10-CM | POA: Diagnosis not present

## 2023-10-29 DIAGNOSIS — J069 Acute upper respiratory infection, unspecified: Secondary | ICD-10-CM | POA: Diagnosis not present

## 2023-11-05 ENCOUNTER — Encounter (HOSPITAL_COMMUNITY): Payer: Self-pay

## 2023-11-05 DIAGNOSIS — I82431 Acute embolism and thrombosis of right popliteal vein: Secondary | ICD-10-CM

## 2023-11-26 ENCOUNTER — Other Ambulatory Visit: Payer: Self-pay

## 2023-11-26 ENCOUNTER — Inpatient Hospital Stay (HOSPITAL_BASED_OUTPATIENT_CLINIC_OR_DEPARTMENT_OTHER): Payer: BC Managed Care – PPO | Admitting: Family

## 2023-11-26 ENCOUNTER — Inpatient Hospital Stay: Payer: BC Managed Care – PPO | Attending: Hematology & Oncology

## 2023-11-26 ENCOUNTER — Encounter: Payer: Self-pay | Admitting: Family

## 2023-11-26 ENCOUNTER — Other Ambulatory Visit: Payer: Self-pay | Admitting: Family

## 2023-11-26 VITALS — BP 123/82 | HR 59 | Temp 98.0°F | Resp 17 | Ht 71.0 in | Wt 219.0 lb

## 2023-11-26 DIAGNOSIS — D6859 Other primary thrombophilia: Secondary | ICD-10-CM

## 2023-11-26 DIAGNOSIS — Z808 Family history of malignant neoplasm of other organs or systems: Secondary | ICD-10-CM | POA: Diagnosis not present

## 2023-11-26 DIAGNOSIS — I82431 Acute embolism and thrombosis of right popliteal vein: Secondary | ICD-10-CM | POA: Diagnosis not present

## 2023-11-26 DIAGNOSIS — Z7901 Long term (current) use of anticoagulants: Secondary | ICD-10-CM

## 2023-11-26 DIAGNOSIS — I82541 Chronic embolism and thrombosis of right tibial vein: Secondary | ICD-10-CM | POA: Diagnosis not present

## 2023-11-26 DIAGNOSIS — Z803 Family history of malignant neoplasm of breast: Secondary | ICD-10-CM | POA: Insufficient documentation

## 2023-11-26 DIAGNOSIS — R768 Other specified abnormal immunological findings in serum: Secondary | ICD-10-CM | POA: Diagnosis not present

## 2023-11-26 DIAGNOSIS — Z800 Family history of malignant neoplasm of digestive organs: Secondary | ICD-10-CM | POA: Diagnosis not present

## 2023-11-26 LAB — CMP (CANCER CENTER ONLY)
ALT: 23 U/L (ref 0–44)
AST: 28 U/L (ref 15–41)
Albumin: 4.9 g/dL (ref 3.5–5.0)
Alkaline Phosphatase: 76 U/L (ref 38–126)
Anion gap: 8 (ref 5–15)
BUN: 13 mg/dL (ref 6–20)
CO2: 30 mmol/L (ref 22–32)
Calcium: 9.3 mg/dL (ref 8.9–10.3)
Chloride: 100 mmol/L (ref 98–111)
Creatinine: 1.12 mg/dL (ref 0.61–1.24)
GFR, Estimated: >60 mL/min (ref >60)
Glucose, Bld: 90 mg/dL (ref 70–99)
Potassium: 3.8 mmol/L (ref 3.5–5.1)
Sodium: 138 mmol/L (ref 135–145)
Total Bilirubin: 1.1 mg/dL (ref 0.0–1.2)
Total Protein: 7.7 g/dL (ref 6.5–8.1)

## 2023-11-26 LAB — CBC WITH DIFFERENTIAL (CANCER CENTER ONLY)
Abs Immature Granulocytes: 0.03 10*3/uL (ref 0.00–0.07)
Basophils Absolute: 0 10*3/uL (ref 0.0–0.1)
Basophils Relative: 0 %
Eosinophils Absolute: 0.1 10*3/uL (ref 0.0–0.5)
Eosinophils Relative: 1 %
HCT: 43.9 % (ref 39.0–52.0)
Hemoglobin: 15.4 g/dL (ref 13.0–17.0)
Immature Granulocytes: 1 %
Lymphocytes Relative: 34 %
Lymphs Abs: 2.2 10*3/uL (ref 0.7–4.0)
MCH: 32.5 pg (ref 26.0–34.0)
MCHC: 35.1 g/dL (ref 30.0–36.0)
MCV: 92.6 fL (ref 80.0–100.0)
Monocytes Absolute: 0.6 10*3/uL (ref 0.1–1.0)
Monocytes Relative: 9 %
Neutro Abs: 3.5 10*3/uL (ref 1.7–7.7)
Neutrophils Relative %: 55 %
Platelet Count: 176 10*3/uL (ref 150–400)
RBC: 4.74 MIL/uL (ref 4.22–5.81)
RDW: 12 % (ref 11.5–15.5)
WBC Count: 6.3 10*3/uL (ref 4.0–10.5)
nRBC: 0.3 % — ABNORMAL HIGH (ref 0.0–0.2)

## 2023-11-26 LAB — LACTATE DEHYDROGENASE: LDH: 216 U/L — ABNORMAL HIGH (ref 98–192)

## 2023-11-26 LAB — ANTITHROMBIN III: AntiThromb III Func: 114 % (ref 75–120)

## 2023-11-26 NOTE — Progress Notes (Signed)
 Hematology/Oncology Consultation   Name: Mark Boyle      MRN: [id number]    Location: Room/bed info not found  Date: 11/26/2023 Time:11:04 AM   REFERRING PHYSICIAN: Debby Robertson, MD  REASON FOR CONSULT: Acute DVT of the popliteal vein of right lower extremity   DIAGNOSIS:  DVT within the right lower extremity acute popliteal/chronic posterior tibial, peroneal and TPT  HISTORY OF PRESENT ILLNESS: [name] is a pleasant 48 yo gentleman with diagnosis of 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 No prior history of thrombotic event.  He had started with right calf pain on 10/1 and over the course of a few weeks this progressed into the the ankle and foot while doing two daily workouts.  He also had a 5 week bout of bronchitis during that same time. He states that he was Covid negative.  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  He is tolerating Eliquis  nicely. Swelling his significantly improved. Negative Homan's sign at this time.  Pedal pulse in the right foot is 2+.  He has a first cousin that has had several thrombotic events after being hit by a [employer] truck.  He last had Covid in March 2024. He received the 2 dose regimen of the Moderna vaccine in 2021 and has received one booster.  No testosterone use.  He has a pre work out drink sometimes before exercising.  No smoking or recreational drug use.  Occasionally has 1 beer.  No personal history of cancer. Paternal grandmother had breast, maternal grandmother had multiple myeloma and father had esophageal.  He had a colonoscopy last month and had 1 benign polyp removed. He was also noted to have non-bleeding internal hemorrhoids.  No history of diabetes or thyroid disease.  No fever, chills, n/v, cough, rash, dizziness, SOB, chest pain, palpitations, abdominal pain or changes in bowel or bladder habits.  No numbness or tingling in his  extremities.  No falls or syncope.  Appetite and hydration are good. Weight is stable at 219 lbs.   ROS: All other 10 point review of systems is negative.   PAST MEDICAL HISTORY:   Past Medical History:  Diagnosis Date   Hyperlipidemia     ALLERGIES: No Known Allergies    MEDICATIONS:  Current Outpatient Medications on File Prior to Visit  Medication Sig Dispense Refill   apixaban  (ELIQUIS ) 5 MG TABS tablet Take 1 tablet (5 mg total) by mouth 2 (two) times daily. Start taking after completion of starter pack. 60 tablet 5   rosuvastatin (CRESTOR) 10 MG tablet Take 10 mg by mouth daily.     No current facility-administered medications on file prior to visit.     PAST SURGICAL HISTORY Past Surgical History:  Procedure Laterality Date   SHOULDER SURGERY  2002    FAMILY HISTORY: Family History  Problem Relation Age of Onset   Esophageal cancer Father 34   Colon cancer Neg Hx    Rectal cancer Neg Hx    Stomach cancer Neg Hx    Colon polyps Neg Hx     SOCIAL HISTORY:  reports that he has never smoked. He has never used smokeless tobacco. He reports current alcohol  use. He reports that he does not use drugs.  PERFORMANCE STATUS: The patient's performance status is 1 - Symptomatic but completely ambulatory  PHYSICAL EXAM: Most Recent Vital Signs: Blood pressure 123/82,  pulse (!) 59, temperature 98 F (36.7 C), temperature source Oral, resp. rate 17, height 5' 11 (1.803 m), weight 219 lb (99.3 kg), SpO2 98%. BP 123/82 (BP Location: Right Arm, Patient Position: Sitting)   Pulse (!) 59   Temp 98 F (36.7 C) (Oral)   Resp 17   Ht 5' 11 (1.803 m)   Wt 219 lb (99.3 kg)   SpO2 98%   BMI 30.54 kg/m   General Appearance:    Alert, cooperative, no distress, appears stated age  Head:    Normocephalic, without obvious abnormality, atraumatic  Eyes:    PERRL, conjunctiva/corneas clear, EOM's intact, fundi    benign, both eyes             Throat:   Lips, mucosa, and  tongue normal; teeth and gums normal  Neck:   Supple, symmetrical, trachea midline, no adenopathy;       thyroid:  No enlargement/tenderness/nodules; no carotid   bruit or JVD  Back:     Symmetric, no curvature, ROM normal, no CVA tenderness  Lungs:     Clear to auscultation bilaterally, respirations unlabored  Chest wall:    No tenderness or deformity  Heart:    Regular rate and rhythm, S1 and S2 normal, no murmur, rub   or gallop  Abdomen:     Soft, non-tender, bowel sounds active all four quadrants,    no masses, no organomegaly        Extremities:   Extremities normal, atraumatic, no cyanosis or edema  Pulses:   2+ and symmetric all extremities  Skin:   Skin color, texture, turgor normal, no rashes or lesions  Lymph nodes:   Cervical, supraclavicular, and axillary nodes normal  Neurologic:   CNII-XII intact. Normal strength, sensation and reflexes      throughout    LABORATORY DATA:  Results for orders placed or performed in visit on 11/26/23 (from the past 48 hours)  CBC with Differential (Cancer Center Only)     Status: Abnormal   Collection Time: 11/26/23 10:23 AM  Result Value Ref Range   WBC Count 6.3 4.0 - 10.5 K/uL   RBC 4.74 4.22 - 5.81 MIL/uL   Hemoglobin 15.4 13.0 - 17.0 g/dL   HCT 56.0 60.9 - 47.9 %   MCV 92.6 80.0 - 100.0 fL   MCH 32.5 26.0 - 34.0 pg   MCHC 35.1 30.0 - 36.0 g/dL   RDW 87.9 88.4 - 84.4 %   Platelet Count 176 150 - 400 K/uL   nRBC 0.3 (H) 0.0 - 0.2 %   Neutrophils Relative % 55 %   Neutro Abs 3.5 1.7 - 7.7 K/uL   Lymphocytes Relative 34 %   Lymphs Abs 2.2 0.7 - 4.0 K/uL   Monocytes Relative 9 %   Monocytes Absolute 0.6 0.1 - 1.0 K/uL   Eosinophils Relative 1 %   Eosinophils Absolute 0.1 0.0 - 0.5 K/uL   Basophils Relative 0 %   Basophils Absolute 0.0 0.0 - 0.1 K/uL   Immature Granulocytes 1 %   Abs Immature Granulocytes 0.03 0.00 - 0.07 K/uL    Comment: Performed at [facility] at [facility], [address] [place], [place] [address]      RADIOGRAPHY: No results found.     PATHOLOGY: None  ASSESSMENT/PLAN: [name] is a pleasant 48 yo gentleman with diagnosis of 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  He will continue his same  regimen with Eliquis  5 mg PO BID.  Hyper coag panel pending.  We will plan to see him back in about 7 weeks for repeat US  and follow-up.   All questions were answered. The patient knows to call the clinic with any problems, questions or concerns. We can certainly see the patient much sooner if necessary.  The patient was discussed with Dr. Timmy and he is in agreement with the aforementioned.   Lauraine Pepper, NP

## 2023-11-27 ENCOUNTER — Other Ambulatory Visit (HOSPITAL_COMMUNITY): Payer: Self-pay

## 2023-11-27 LAB — CARDIOLIPIN ANTIBODIES, IGG, IGM, IGA
Anticardiolipin IgA: <9 [APL'U]/mL (ref 0–11)
Anticardiolipin IgG: <9 [GPL'U]/mL (ref 0–14)
Anticardiolipin IgM: <9 [MPL'U]/mL (ref 0–12)

## 2023-11-27 LAB — PROTEIN C ACTIVITY: Protein C Activity: 108 % (ref 73–180)

## 2023-11-27 LAB — PROTEIN C, TOTAL: Protein C, Total: 101 % (ref 60–150)

## 2023-11-27 LAB — BETA-2-GLYCOPROTEIN I ABS, IGG/M/A
Beta-2 Glyco I IgG: <9 GPI IgG units (ref 0–20)
Beta-2-Glycoprotein I IgA: 31 GPI IgA units — ABNORMAL HIGH (ref 0–25)
Beta-2-Glycoprotein I IgM: <9 GPI IgM units (ref 0–32)

## 2023-11-27 LAB — LUPUS ANTICOAGULANT PANEL
DRVVT: 42.8 s (ref 0.0–47.0)
PTT Lupus Anticoagulant: 32 s (ref 0.0–43.5)

## 2023-11-27 LAB — PROTEIN S, TOTAL: Protein S Ag, Total: 69 % (ref 60–150)

## 2023-11-27 LAB — PROTEIN S ACTIVITY: Protein S Activity: 94 % (ref 63–140)

## 2023-11-27 LAB — HOMOCYSTEINE: Homocysteine: 8.7 umol/L (ref 0.0–14.5)

## 2023-11-29 ENCOUNTER — Other Ambulatory Visit: Payer: Self-pay

## 2023-11-29 ENCOUNTER — Other Ambulatory Visit (HOSPITAL_COMMUNITY): Payer: Self-pay

## 2023-11-30 LAB — PROTHROMBIN GENE MUTATION

## 2023-12-01 LAB — FACTOR 5 LEIDEN

## 2023-12-02 ENCOUNTER — Telehealth: Payer: Self-pay | Admitting: Family

## 2023-12-02 NOTE — Telephone Encounter (Signed)
 I was able to speak with the patient and go over hyper coag panel results. No changes at this time. We will see him at follow-up and re-eavluate with [place]. No questions. Patient appreciative of call.

## 2024-01-02 ENCOUNTER — Other Ambulatory Visit: Payer: Self-pay

## 2024-01-03 ENCOUNTER — Other Ambulatory Visit (HOSPITAL_COMMUNITY): Payer: Self-pay

## 2024-01-03 DIAGNOSIS — R911 Solitary pulmonary nodule: Secondary | ICD-10-CM | POA: Diagnosis not present

## 2024-01-17 ENCOUNTER — Encounter: Payer: Self-pay | Admitting: Family

## 2024-01-17 ENCOUNTER — Inpatient Hospital Stay (HOSPITAL_BASED_OUTPATIENT_CLINIC_OR_DEPARTMENT_OTHER): Payer: BC Managed Care – PPO | Admitting: Family

## 2024-01-17 ENCOUNTER — Inpatient Hospital Stay: Payer: BC Managed Care – PPO | Attending: Family

## 2024-01-17 ENCOUNTER — Ambulatory Visit (HOSPITAL_BASED_OUTPATIENT_CLINIC_OR_DEPARTMENT_OTHER)
Admission: RE | Admit: 2024-01-17 | Discharge: 2024-01-17 | Disposition: A | Discharge status: Home / Self Care | Payer: BC Managed Care – PPO | Source: Ambulatory Visit | Attending: Family | Admitting: Family

## 2024-01-17 VITALS — BP 124/82 | HR 48 | Temp 99.0°F | Resp 18 | Wt 222.8 lb

## 2024-01-17 DIAGNOSIS — I82431 Acute embolism and thrombosis of right popliteal vein: Secondary | ICD-10-CM

## 2024-01-17 DIAGNOSIS — R768 Other specified abnormal immunological findings in serum: Secondary | ICD-10-CM | POA: Insufficient documentation

## 2024-01-17 DIAGNOSIS — Z7901 Long term (current) use of anticoagulants: Secondary | ICD-10-CM | POA: Diagnosis not present

## 2024-01-17 DIAGNOSIS — I82441 Acute embolism and thrombosis of right tibial vein: Secondary | ICD-10-CM | POA: Insufficient documentation

## 2024-01-17 DIAGNOSIS — D6859 Other primary thrombophilia: Secondary | ICD-10-CM | POA: Diagnosis not present

## 2024-01-17 LAB — CBC WITH DIFFERENTIAL (CANCER CENTER ONLY)
Abs Immature Granulocytes: 0.01 10*3/uL (ref 0.00–0.07)
Basophils Absolute: 0 10*3/uL (ref 0.0–0.1)
Basophils Relative: 1 %
Eosinophils Absolute: 0.1 10*3/uL (ref 0.0–0.5)
Eosinophils Relative: 1 %
HCT: 42.4 % (ref 39.0–52.0)
Hemoglobin: 15 g/dL (ref 13.0–17.0)
Immature Granulocytes: 0 %
Lymphocytes Relative: 46 %
Lymphs Abs: 2.5 10*3/uL (ref 0.7–4.0)
MCH: 32.9 pg (ref 26.0–34.0)
MCHC: 35.4 g/dL (ref 30.0–36.0)
MCV: 93 fL (ref 80.0–100.0)
Monocytes Absolute: 0.4 10*3/uL (ref 0.1–1.0)
Monocytes Relative: 8 %
Neutro Abs: 2.4 10*3/uL (ref 1.7–7.7)
Neutrophils Relative %: 44 %
Platelet Count: 182 10*3/uL (ref 150–400)
RBC: 4.56 MIL/uL (ref 4.22–5.81)
RDW: 12.2 % (ref 11.5–15.5)
WBC Count: 5.5 10*3/uL (ref 4.0–10.5)
nRBC: 0 % (ref 0.0–0.2)

## 2024-01-17 LAB — CMP (CANCER CENTER ONLY)
ALT: 20 U/L (ref 0–44)
AST: 27 U/L (ref 15–41)
Albumin: 4.6 g/dL (ref 3.5–5.0)
Alkaline Phosphatase: 57 U/L (ref 38–126)
Anion gap: 6 (ref 5–15)
BUN: 12 mg/dL (ref 6–20)
CO2: 30 mmol/L (ref 22–32)
Calcium: 9.4 mg/dL (ref 8.9–10.3)
Chloride: 104 mmol/L (ref 98–111)
Creatinine: 1.05 mg/dL (ref 0.61–1.24)
GFR, Estimated: >60 mL/min (ref >60)
Glucose, Bld: 84 mg/dL (ref 70–99)
Potassium: 4.1 mmol/L (ref 3.5–5.1)
Sodium: 140 mmol/L (ref 135–145)
Total Bilirubin: 0.8 mg/dL (ref 0.0–1.2)
Total Protein: 6.6 g/dL (ref 6.5–8.1)

## 2024-01-17 LAB — LACTATE DEHYDROGENASE: LDH: 196 U/L — ABNORMAL HIGH (ref 98–192)

## 2024-01-17 NOTE — Progress Notes (Signed)
 Hematology and Oncology Follow Up Visit  Mark Boyle [id number] 1976-04-20 48 y.o. 01/17/2024   Principle Diagnosis:  DVT within the right lower extremity chronic popliteal and posterior tibial veins  Current Therapy:   Eliquis 5 mg PO BID   Interim History:  Mark Boyle is here today with his wife for follow-up. He is doing well but does note some tightness in his right calf after running a couple miles. No pain or redness. Pedal pulses 2+.  He wears compression stockings daily for added support.  Repeat US this AM showed chronic occlusive posterior tibial thrombus and non occlusive age indeterminate popliteal vein thrombus.  He is taking his Eliquis 5 mg PO BID as prescribed.  No blood loss noted. No abnormal bruising or petechiae.  No fever, chills, n/v, cough, rash, dizziness, SOB, chest pain, palpitations, abdominal pain or changes in bowel or bladder habits.  No numbness or tingling in the extremities.  No falls or syncope.  Appetite and hydration are good. Weight is stable at 222 lbs.   ECOG Performance Status: 1 - Symptomatic but completely ambulatory  Medications:  Allergies as of 01/17/2024   No Known Allergies      Medication List        Accurate as of January 17, 2024 10:02 AM. If you have any questions, ask your nurse or doctor.          Eliquis 5 MG Tabs tablet Generic drug: apixaban Take 1 tablet (5 mg total) by mouth 2 (two) times daily. Start taking after completion of starter pack.   rosuvastatin 10 MG tablet Commonly known as: CRESTOR Take 10 mg by mouth daily.        Allergies: No Known Allergies  Past Medical History, Surgical history, Social history, and Family History were reviewed and updated.  Review of Systems: All other 10 point review of systems is negative.   Physical Exam:  weight is 222 lb 12.8 oz (101.1 kg). His oral temperature is 99 F (37.2 C). His blood pressure is 124/82 and his pulse is 48 (abnormal). His  respiration is 18.   Wt Readings from Last 3 Encounters:  01/17/24 222 lb 12.8 oz (101.1 kg)  11/26/23 219 lb (99.3 kg)  09/27/23 224 lb 12.8 oz (102 kg)    Ocular: Sclerae unicteric, pupils equal, round and reactive to light Ear-nose-throat: Oropharynx clear, dentition fair Lymphatic: No cervical or supraclavicular adenopathy Lungs no rales or rhonchi, good excursion bilaterally Heart regular rate and rhythm, no murmur appreciated Abd soft, nontender, positive bowel sounds MSK no focal spinal tenderness, no joint edema Neuro: non-focal, well-oriented, appropriate affect Breasts: Deferred   Lab Results  Component Value Date   WBC 5.5 01/17/2024   HGB 15.0 01/17/2024   HCT 42.4 01/17/2024   MCV 93.0 01/17/2024   PLT 182 01/17/2024   No results found for: "FERRITIN", "IRON", "TIBC", "UIBC", "IRONPCTSAT" Lab Results  Component Value Date   RBC 4.56 01/17/2024   No results found for: "KPAFRELGTCHN", "LAMBDASER", "KAPLAMBRATIO" No results found for: "IGGSERUM", "IGA", "IGMSERUM" No results found for: "TOTALPROTELP", "ALBUMINELP", "A1GS", "A2GS", "BETS", "BETA2SER", "GAMS", "MSPIKE", "SPEI"   Chemistry      Component Value Date/Time   NA 138 11/26/2023 1023   K 3.8 11/26/2023 1023   CL 100 11/26/2023 1023   CO2 30 11/26/2023 1023   BUN 13 11/26/2023 1023   CREATININE 1.12 11/26/2023 1023      Component Value Date/Time   CALCIUM 9.3 11/26/2023 1023   ALKPHOS 76  11/26/2023 1023   AST 28 11/26/2023 1023   ALT 23 11/26/2023 1023   BILITOT 1.1 11/26/2023 1023       Impression and Plan: Mark Boyle is a pleasant 48 yo gentleman with diagnosis of 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  He will continue his same regimen with Eliquis 5 mg PO BID.  Hyper coag panel only showed a very mildly elevated Beta 2 Glycoprotein I IgA.  6 month follow-up. He will go a week or so prior to follow-up for one more US of the right leg with [facility] as this is less  expensive. Order is in as external.   Eileen Stanford, NP 2/24/202510:02 AM

## 2024-01-19 LAB — BETA-2-GLYCOPROTEIN I ABS, IGG/M/A
Beta-2 Glyco I IgG: <9 GPI IgG units (ref 0–20)
Beta-2-Glycoprotein I IgA: 27 GPI IgA units — ABNORMAL HIGH (ref 0–25)
Beta-2-Glycoprotein I IgM: <9 GPI IgM units (ref 0–32)

## 2024-01-24 ENCOUNTER — Encounter: Payer: Self-pay | Admitting: Family

## 2024-01-25 ENCOUNTER — Encounter: Payer: Self-pay | Admitting: Family

## 2024-01-28 ENCOUNTER — Other Ambulatory Visit (HOSPITAL_COMMUNITY): Payer: Self-pay

## 2024-02-28 ENCOUNTER — Other Ambulatory Visit (HOSPITAL_COMMUNITY): Payer: Self-pay

## 2024-03-31 ENCOUNTER — Other Ambulatory Visit (HOSPITAL_COMMUNITY): Payer: Self-pay

## 2024-03-31 ENCOUNTER — Other Ambulatory Visit: Payer: Self-pay

## 2024-04-23 ENCOUNTER — Encounter: Payer: Self-pay | Admitting: Family

## 2024-04-24 ENCOUNTER — Other Ambulatory Visit: Payer: Self-pay | Admitting: *Deleted

## 2024-04-24 ENCOUNTER — Other Ambulatory Visit: Payer: Self-pay

## 2024-04-24 MED ORDER — APIXABAN 5 MG PO TABS
5.0000 mg | ORAL_TABLET | Freq: Two times a day (BID) | ORAL | 5 refills | Status: DC
  Filled 2024-04-24: qty 60, 30d supply, fill #0
  Filled 2024-06-04: qty 60, 30d supply, fill #1
  Filled 2024-07-09: qty 60, 30d supply, fill #2

## 2024-06-05 ENCOUNTER — Other Ambulatory Visit (HOSPITAL_COMMUNITY): Payer: Self-pay

## 2024-06-23 DIAGNOSIS — Z125 Encounter for screening for malignant neoplasm of prostate: Secondary | ICD-10-CM | POA: Diagnosis not present

## 2024-06-23 DIAGNOSIS — E785 Hyperlipidemia, unspecified: Secondary | ICD-10-CM | POA: Diagnosis not present

## 2024-06-26 DIAGNOSIS — D225 Melanocytic nevi of trunk: Secondary | ICD-10-CM | POA: Diagnosis not present

## 2024-06-26 DIAGNOSIS — L905 Scar conditions and fibrosis of skin: Secondary | ICD-10-CM | POA: Diagnosis not present

## 2024-06-26 DIAGNOSIS — D485 Neoplasm of uncertain behavior of skin: Secondary | ICD-10-CM | POA: Diagnosis not present

## 2024-06-26 DIAGNOSIS — D2262 Melanocytic nevi of left upper limb, including shoulder: Secondary | ICD-10-CM | POA: Diagnosis not present

## 2024-06-26 DIAGNOSIS — D2261 Melanocytic nevi of right upper limb, including shoulder: Secondary | ICD-10-CM | POA: Diagnosis not present

## 2024-06-30 DIAGNOSIS — Z0000 Encounter for general adult medical examination without abnormal findings: Secondary | ICD-10-CM | POA: Diagnosis not present

## 2024-06-30 DIAGNOSIS — I825Z1 Chronic embolism and thrombosis of unspecified deep veins of right distal lower extremity: Secondary | ICD-10-CM | POA: Diagnosis not present

## 2024-06-30 DIAGNOSIS — R82998 Other abnormal findings in urine: Secondary | ICD-10-CM | POA: Diagnosis not present

## 2024-06-30 DIAGNOSIS — Z1339 Encounter for screening examination for other mental health and behavioral disorders: Secondary | ICD-10-CM | POA: Diagnosis not present

## 2024-06-30 DIAGNOSIS — Z1331 Encounter for screening for depression: Secondary | ICD-10-CM | POA: Diagnosis not present

## 2024-07-10 ENCOUNTER — Other Ambulatory Visit: Payer: Self-pay

## 2024-07-10 ENCOUNTER — Encounter: Payer: Self-pay | Admitting: Pharmacist

## 2024-07-10 ENCOUNTER — Other Ambulatory Visit (HOSPITAL_COMMUNITY): Payer: Self-pay

## 2024-07-12 ENCOUNTER — Other Ambulatory Visit (HOSPITAL_COMMUNITY): Payer: Self-pay

## 2024-07-13 ENCOUNTER — Other Ambulatory Visit (HOSPITAL_COMMUNITY): Payer: Self-pay

## 2024-07-13 ENCOUNTER — Other Ambulatory Visit: Payer: Self-pay

## 2024-07-17 ENCOUNTER — Inpatient Hospital Stay: Payer: BC Managed Care – PPO

## 2024-07-17 ENCOUNTER — Inpatient Hospital Stay: Payer: BC Managed Care – PPO | Admitting: Family

## 2024-07-31 ENCOUNTER — Other Ambulatory Visit (HOSPITAL_COMMUNITY): Payer: Self-pay

## 2024-07-31 ENCOUNTER — Inpatient Hospital Stay: Attending: Family

## 2024-07-31 ENCOUNTER — Inpatient Hospital Stay (HOSPITAL_BASED_OUTPATIENT_CLINIC_OR_DEPARTMENT_OTHER): Admitting: Family

## 2024-07-31 VITALS — BP 121/82 | HR 60 | Temp 98.6°F | Resp 18 | Ht 71.0 in | Wt 224.0 lb

## 2024-07-31 DIAGNOSIS — D6859 Other primary thrombophilia: Secondary | ICD-10-CM | POA: Diagnosis not present

## 2024-07-31 DIAGNOSIS — I82431 Acute embolism and thrombosis of right popliteal vein: Secondary | ICD-10-CM

## 2024-07-31 DIAGNOSIS — Z09 Encounter for follow-up examination after completed treatment for conditions other than malignant neoplasm: Secondary | ICD-10-CM | POA: Insufficient documentation

## 2024-07-31 DIAGNOSIS — Z7901 Long term (current) use of anticoagulants: Secondary | ICD-10-CM | POA: Insufficient documentation

## 2024-07-31 DIAGNOSIS — Z86718 Personal history of other venous thrombosis and embolism: Secondary | ICD-10-CM | POA: Insufficient documentation

## 2024-07-31 LAB — CBC WITH DIFFERENTIAL (CANCER CENTER ONLY)
Abs Immature Granulocytes: 0.01 K/uL (ref 0.00–0.07)
Basophils Absolute: 0 K/uL (ref 0.0–0.1)
Basophils Relative: 0 %
Eosinophils Absolute: 0.1 K/uL (ref 0.0–0.5)
Eosinophils Relative: 1 %
HCT: 41.4 % (ref 39.0–52.0)
Hemoglobin: 14.7 g/dL (ref 13.0–17.0)
Immature Granulocytes: 0 %
Lymphocytes Relative: 34 %
Lymphs Abs: 2.2 K/uL (ref 0.7–4.0)
MCH: 32.1 pg (ref 26.0–34.0)
MCHC: 35.5 g/dL (ref 30.0–36.0)
MCV: 90.4 fL (ref 80.0–100.0)
Monocytes Absolute: 0.4 K/uL (ref 0.1–1.0)
Monocytes Relative: 7 %
Neutro Abs: 3.7 K/uL (ref 1.7–7.7)
Neutrophils Relative %: 58 %
Platelet Count: 174 K/uL (ref 150–400)
RBC: 4.58 MIL/uL (ref 4.22–5.81)
RDW: 11.9 % (ref 11.5–15.5)
WBC Count: 6.4 K/uL (ref 4.0–10.5)
nRBC: 0 % (ref 0.0–0.2)

## 2024-07-31 LAB — CMP (CANCER CENTER ONLY)
ALT: 21 U/L (ref 0–44)
AST: 33 U/L (ref 15–41)
Albumin: 4.6 g/dL (ref 3.5–5.0)
Alkaline Phosphatase: 83 U/L (ref 38–126)
Anion gap: 10 (ref 5–15)
BUN: 11 mg/dL (ref 6–20)
CO2: 26 mmol/L (ref 22–32)
Calcium: 8.9 mg/dL (ref 8.9–10.3)
Chloride: 105 mmol/L (ref 98–111)
Creatinine: 1.07 mg/dL (ref 0.61–1.24)
GFR, Estimated: >60 mL/min (ref >60)
Glucose, Bld: 104 mg/dL — ABNORMAL HIGH (ref 70–99)
Potassium: 4.4 mmol/L (ref 3.5–5.1)
Sodium: 140 mmol/L (ref 135–145)
Total Bilirubin: 0.7 mg/dL (ref 0.0–1.2)
Total Protein: 6.8 g/dL (ref 6.5–8.1)

## 2024-07-31 MED ORDER — APIXABAN 2.5 MG PO TABS
2.5000 mg | ORAL_TABLET | Freq: Two times a day (BID) | ORAL | 6 refills | Status: AC
  Filled 2024-07-31: qty 60, 30d supply, fill #0
  Filled 2024-09-17: qty 60, 30d supply, fill #1
  Filled 2024-10-15: qty 60, 30d supply, fill #2
  Filled 2024-11-13: qty 60, 30d supply, fill #3
  Filled 2024-12-16: qty 60, 30d supply, fill #4

## 2024-07-31 NOTE — Progress Notes (Signed)
 Hematology and Oncology Follow Up Visit  Mark Boyle [id number] 10/22/1976 48 y.o. 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 He is happy to hear that his repeat US  done at [facility] last month showed no evidence of DVT. He does have what looks like some scarring in the distal popliteal vein.  He is taking his Eliquis  daily as prescribed. He bruises easily but not in excess.  No abnormal blood loss. No petechiae.  He has occasional pain behind the knee with over exertion.  He has started running regularly and is taking his time preparing for a marathon.  He will wear a compression stocking when running and stay well hydrated.  No fever, chills, n/v, cough, rash, dizziness, SOB, chest pain, palpitations, abdominal pain or changes in bowel or bladder habits.  No swelling, numbness or tingling in his extremities.  No falls or syncope reported.  Appetite and hydration are good. Weight is stable at 224 lbs.   ECOG Performance Status: 1 - Symptomatic but completely ambulatory  Medications:  Allergies as of 07/31/2024   No Known Allergies      Medication List        Accurate as of July 31, 2024  2:28 PM. If you have any questions, ask your nurse or doctor.          apixaban  2.5 MG Tabs tablet Commonly known as: Eliquis  Take 1 tablet (2.5 mg total) by mouth 2 (two) times daily. Start taking after completion of starter pack. What changed:  medication strength how much to take Changed by: Lauraine Pepper   rosuvastatin 10 MG tablet Commonly known as: CRESTOR Take 10 mg by mouth daily.        Allergies: No Known Allergies  Past Medical History, Surgical history, Social history, and Family History were reviewed and updated.  Review of Systems: All other 10 point review of systems is  negative.   Physical Exam:  height is 5' 11 (1.803 m) and weight is 224 lb (101.6 kg). His oral temperature is 98.6 F (37 C). His blood pressure is 121/82 and his pulse is 60. His respiration is 18 and oxygen saturation is 98%.   Wt Readings from Last 3 Encounters:  07/31/24 224 lb (101.6 kg)  01/17/24 222 lb 12.8 oz (101.1 kg)  11/26/23 219 lb (99.3 kg)    Ocular: Sclerae unicteric, pupils equal, round and reactive to light Ear-nose-throat: Oropharynx clear, dentition fair Lymphatic: No cervical or supraclavicular adenopathy Lungs no rales or rhonchi, good excursion bilaterally Heart regular rate and rhythm, no murmur appreciated Abd soft, nontender, positive bowel sounds MSK no focal spinal tenderness, no joint edema Neuro: non-focal, well-oriented, appropriate affect Breasts: Deferred   Lab Results  Component Value Date   WBC 6.4 07/31/2024   HGB 14.7 07/31/2024   HCT 41.4 07/31/2024   MCV 90.4 07/31/2024   PLT 174 07/31/2024   No results found for: FERRITIN, IRON, TIBC, UIBC, IRONPCTSAT Lab Results  Component Value Date   RBC 4.58 07/31/2024   No results found for: KPAFRELGTCHN, LAMBDASER, KAPLAMBRATIO No results found for: IGGSERUM, IGA, IGMSERUM No results found for: STEPHANY RINGS, A1GS, A2GS, BETS, BETA2SER, GAMS, MSPIKE, SPEI   Chemistry      Component Value Date/Time   NA 140 07/31/2024 1228  K 4.4 07/31/2024 1228   CL 105 07/31/2024 1228   CO2 26 07/31/2024 1228   BUN 11 07/31/2024 1228   CREATININE 1.07 07/31/2024 1228      Component Value Date/Time   CALCIUM 8.9 07/31/2024 1228   ALKPHOS 83 07/31/2024 1228   AST 33 07/31/2024 1228   ALT 21 07/31/2024 1228   BILITOT 0.7 07/31/2024 1228       Impression and Plan:  Mark Boyle is a pleasant 48 yo gentleman with diagnosis of 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  He will continue his same regimen with Eliquis  5 mg PO BID.   Hyper coag panel only showed a very mildly elevated Beta 2 Glycoprotein I IgA.  6 month follow-up.   Lauraine Pepper, NP 9/8/20252:28 PM

## 2024-08-01 ENCOUNTER — Other Ambulatory Visit: Payer: Self-pay

## 2024-08-02 LAB — BETA-2-GLYCOPROTEIN I ABS, IGG/M/A
Beta-2 Glyco I IgG: <9 GPI IgG units (ref 0–20)
Beta-2-Glycoprotein I IgA: 35 GPI IgA units — ABNORMAL HIGH (ref 0–25)
Beta-2-Glycoprotein I IgM: <9 GPI IgM units (ref 0–32)

## 2024-09-18 ENCOUNTER — Other Ambulatory Visit (HOSPITAL_COMMUNITY): Payer: Self-pay

## 2024-10-16 ENCOUNTER — Other Ambulatory Visit: Payer: Self-pay

## 2024-10-16 ENCOUNTER — Other Ambulatory Visit (HOSPITAL_COMMUNITY): Payer: Self-pay

## 2024-11-14 ENCOUNTER — Other Ambulatory Visit: Payer: Self-pay

## 2024-11-14 ENCOUNTER — Other Ambulatory Visit (HOSPITAL_COMMUNITY): Payer: Self-pay

## 2024-11-15 ENCOUNTER — Encounter (HOSPITAL_COMMUNITY): Payer: Self-pay

## 2024-11-15 ENCOUNTER — Other Ambulatory Visit (HOSPITAL_COMMUNITY): Payer: Self-pay

## 2024-11-19 ENCOUNTER — Other Ambulatory Visit (HOSPITAL_COMMUNITY): Payer: Self-pay

## 2024-11-21 ENCOUNTER — Other Ambulatory Visit: Payer: Self-pay

## 2024-12-17 ENCOUNTER — Other Ambulatory Visit: Payer: Self-pay

## 2024-12-17 ENCOUNTER — Other Ambulatory Visit (HOSPITAL_COMMUNITY): Payer: Self-pay

## 2025-01-29 ENCOUNTER — Ambulatory Visit: Admitting: Family

## 2025-01-29 ENCOUNTER — Inpatient Hospital Stay
